# Patient Record
Sex: Male | Born: 1995 | Race: Black or African American | Hispanic: No | Marital: Married
Health system: Southern US, Community
[De-identification: ages and names within clinical notes are randomized; demographics above are authoritative.]

## PROBLEM LIST (undated history)

## (undated) DIAGNOSIS — L309 Dermatitis, unspecified: Secondary | ICD-10-CM

## (undated) DIAGNOSIS — T7840XA Allergy, unspecified, initial encounter: Secondary | ICD-10-CM

## (undated) HISTORY — DX: Allergy, unspecified, initial encounter: T78.40XA

## (undated) HISTORY — DX: Dermatitis, unspecified: L30.9

---
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
## 2021-07-25 ENCOUNTER — Other Ambulatory Visit: Payer: Self-pay

## 2021-07-25 ENCOUNTER — Emergency Department (HOSPITAL_COMMUNITY)
Admission: EM | Admit: 2021-07-25 | Discharge: 2021-07-25 | Disposition: A | Discharge status: Home / Self Care | Payer: Self-pay | Attending: Emergency Medicine | Admitting: Emergency Medicine

## 2021-07-25 DIAGNOSIS — X503XXA Overexertion from repetitive movements, initial encounter: Secondary | ICD-10-CM | POA: Insufficient documentation

## 2021-07-25 DIAGNOSIS — S76912A Strain of unspecified muscles, fascia and tendons at thigh level, left thigh, initial encounter: Secondary | ICD-10-CM | POA: Insufficient documentation

## 2021-07-25 DIAGNOSIS — Y9367 Activity, basketball: Secondary | ICD-10-CM | POA: Insufficient documentation

## 2021-07-25 MED ORDER — METHOCARBAMOL 500 MG PO TABS: 500.0000 mg | ORAL_TABLET | Freq: Two times a day (BID) | ORAL | 0 refills | Status: AC

## 2021-07-25 NOTE — Discharge Instructions (Addendum)
Take the medicine as needed to help with your muscle strain. Stretches will help as well.  You can apply heat to the muscle area as well. Make sure you are practicing range of motion. Follow-up with a sports medicine provider listed below. Return to the ER if you start to experience worsening symptoms, leg swelling, redness or warmth of your leg, additional injuries

## 2021-07-25 NOTE — ED Provider Notes (Signed)
[facility] DEPT Provider Note   CSN: [id number] Arrival date & time: 07/25/21  [id number]     History  Chief Complaint  Patient presents with   thigh pain    Louis Myers is a 26 y.o. male presenting to the ED with a chief complaint of left medial thigh pain.  States that on 07/15/2021 he was playing basketball when he believes when he landed after attempting to shoot the ball that he began having pain in his left medial lower thigh area.  Since then he only reports pain when he tries to "do a crossover move."  States that he is ambulating at baseline, able to move his left hip and knee without difficulty.  He denies pain with palpation.  Denies any leg swelling or calf pain.  He has been taking NSAIDs and icing the area with some improvement.  Denies any prior injury similar to this  HPI     Home Medications Prior to Admission medications   Medication Sig Start Date End Date Taking? Authorizing Provider  methocarbamol (ROBAXIN) 500 MG tablet Take 1 tablet (500 mg total) by mouth 2 (two) times daily. 07/25/21  Yes Brent Noto, PA-C  terbinafine (LAMISIL) 250 MG tablet Take 250 mg by mouth daily.      [provider]      Allergies    Patient has no known allergies.    Review of Systems   Review of Systems  Constitutional:  Negative for chills and fever.  Musculoskeletal:  Positive for myalgias.  Skin:  Negative for wound.  Neurological:  Negative for weakness and numbness.   Physical Exam Updated Vital Signs BP (!) 127/117   Pulse 67   Temp 98.1 F (36.7 C) (Oral)   Resp 18   Ht 6\' 3"  (1.905 m)   Wt 79.4 kg   SpO2 98%   BMI 21.87 kg/m  Physical Exam Vitals and nursing note reviewed.  Constitutional:      General: He is not in acute distress.    Appearance: He is well-developed. He is not diaphoretic.  HENT:     Head: Normocephalic and atraumatic.  Eyes:     General: No scleral icterus.    Conjunctiva/sclera:  Conjunctivae normal.  Pulmonary:     Effort: Pulmonary effort is normal. No respiratory distress.  Musculoskeletal:        General: No swelling or tenderness.     Cervical back: Normal range of motion.     Right lower leg: No edema.     Left lower leg: No edema.     Comments: Normal range of motion of left knee and hip.  No erythema, edema or warmth of extremity.  No calf tenderness noted.  2+ DP pulse palpated.  No tenderness to palpation of the thigh.  No deformities.  Skin:    Findings: No rash.  Neurological:     Mental Status: He is alert.    ED Results / Procedures / Treatments   Labs (all labs ordered are listed, but only abnormal results are displayed) Labs Reviewed - No data to display  EKG None  Radiology No results found.  Procedures Procedures    Medications Ordered in ED Medications - No data to display  ED Course/ Medical Decision Making/ A&P                           Medical Decision Making  26 year old male presenting to the  ED with a chief complaint of left medial lower thigh pain after planting his feet during a basketball game on 07/15/2021.  Since then he has only had pain with doing certain movements while playing basketball.  He is able to ambulate without difficulty and move joints distal and proximal to this area.  On physical exam there is no overlying skin changes of the noted area.  No erythema or warmth of joints.  That does not appear swollen.  He has no calf tenderness.  He has equal and intact distal pulses of bilateral lower extremities.  He is ambulating without difficulty.  I doubt that his symptoms are due to a DVT based on physical exam findings, history and the fact that his pain began after playing basketball.  I did discuss ultrasound to rule out a DVT but he is declining.  We will have him follow-up with sports medicine, complete stretches and take muscle relaxer for what appears to be a muscle strain.  Patient is agreeable to the plan.   Return precautions given   Patient is hemodynamically stable, in NAD, and able to ambulate in the ED. Evaluation does not show pathology that would require ongoing emergent intervention or inpatient treatment. I explained the diagnosis to the patient. Pain has been managed and has no complaints prior to discharge. Patient is comfortable with above plan and is stable for discharge at this time. All questions were answered prior to disposition. Strict return precautions for returning to the ED were discussed. Encouraged follow up with PCP.   An After Visit Summary was printed and given to the patient.   Portions of this note were generated with [employer]. Dictation errors may occur despite best attempts at proofreading.         Final Clinical Impression(s) / ED Diagnoses Final diagnoses:  Muscle strain of left thigh, initial encounter    Rx / DC Orders ED Discharge Orders          Ordered    methocarbamol (ROBAXIN) 500 MG tablet  2 times daily        07/25/21 0715              Delia Heady, PA-C 07/25/21 0724    Ezequiel Essex, MD 07/25/21 [phone]

## 2021-07-25 NOTE — ED Triage Notes (Signed)
Pt reports with left thigh pain x 2 weeks. Pt states that he was injured in a game. Pt is able to do certain movements without pain, but others cause him pain. He wants to make sure that he doesn't do any further damage.

## 2022-02-18 DIAGNOSIS — Z419 Encounter for procedure for purposes other than remedying health state, unspecified: Secondary | ICD-10-CM | POA: Diagnosis not present

## 2022-03-21 DIAGNOSIS — Z419 Encounter for procedure for purposes other than remedying health state, unspecified: Secondary | ICD-10-CM | POA: Diagnosis not present

## 2022-04-19 DIAGNOSIS — Z419 Encounter for procedure for purposes other than remedying health state, unspecified: Secondary | ICD-10-CM | POA: Diagnosis not present

## 2022-05-20 DIAGNOSIS — Z419 Encounter for procedure for purposes other than remedying health state, unspecified: Secondary | ICD-10-CM | POA: Diagnosis not present

## 2022-06-19 DIAGNOSIS — Z419 Encounter for procedure for purposes other than remedying health state, unspecified: Secondary | ICD-10-CM | POA: Diagnosis not present

## 2022-07-20 DIAGNOSIS — Z419 Encounter for procedure for purposes other than remedying health state, unspecified: Secondary | ICD-10-CM | POA: Diagnosis not present

## 2022-08-19 DIAGNOSIS — Z419 Encounter for procedure for purposes other than remedying health state, unspecified: Secondary | ICD-10-CM | POA: Diagnosis not present

## 2022-09-14 DIAGNOSIS — Z6829 Body mass index (BMI) 29.0-29.9, adult: Secondary | ICD-10-CM | POA: Diagnosis not present

## 2022-09-14 DIAGNOSIS — E559 Vitamin D deficiency, unspecified: Secondary | ICD-10-CM | POA: Diagnosis not present

## 2022-09-14 DIAGNOSIS — R030 Elevated blood-pressure reading, without diagnosis of hypertension: Secondary | ICD-10-CM | POA: Diagnosis not present

## 2022-09-14 DIAGNOSIS — M545 Low back pain, unspecified: Secondary | ICD-10-CM | POA: Diagnosis not present

## 2022-09-14 DIAGNOSIS — Z0000 Encounter for general adult medical examination without abnormal findings: Secondary | ICD-10-CM | POA: Diagnosis not present

## 2022-09-14 DIAGNOSIS — Z1159 Encounter for screening for other viral diseases: Secondary | ICD-10-CM | POA: Diagnosis not present

## 2022-09-14 DIAGNOSIS — R5383 Other fatigue: Secondary | ICD-10-CM | POA: Diagnosis not present

## 2022-09-19 DIAGNOSIS — Z419 Encounter for procedure for purposes other than remedying health state, unspecified: Secondary | ICD-10-CM | POA: Diagnosis not present

## 2022-10-20 DIAGNOSIS — Z419 Encounter for procedure for purposes other than remedying health state, unspecified: Secondary | ICD-10-CM | POA: Diagnosis not present

## 2022-11-19 DIAGNOSIS — Z419 Encounter for procedure for purposes other than remedying health state, unspecified: Secondary | ICD-10-CM | POA: Diagnosis not present

## 2022-12-20 DIAGNOSIS — Z419 Encounter for procedure for purposes other than remedying health state, unspecified: Secondary | ICD-10-CM | POA: Diagnosis not present

## 2023-01-19 DIAGNOSIS — Z419 Encounter for procedure for purposes other than remedying health state, unspecified: Secondary | ICD-10-CM | POA: Diagnosis not present

## 2023-02-19 DIAGNOSIS — Z419 Encounter for procedure for purposes other than remedying health state, unspecified: Secondary | ICD-10-CM | POA: Diagnosis not present

## 2023-03-22 DIAGNOSIS — Z419 Encounter for procedure for purposes other than remedying health state, unspecified: Secondary | ICD-10-CM | POA: Diagnosis not present

## 2023-04-19 DIAGNOSIS — Z419 Encounter for procedure for purposes other than remedying health state, unspecified: Secondary | ICD-10-CM | POA: Diagnosis not present

## 2023-05-31 DIAGNOSIS — Z419 Encounter for procedure for purposes other than remedying health state, unspecified: Secondary | ICD-10-CM | POA: Diagnosis not present

## 2023-06-30 DIAGNOSIS — Z419 Encounter for procedure for purposes other than remedying health state, unspecified: Secondary | ICD-10-CM | POA: Diagnosis not present

## 2023-07-31 DIAGNOSIS — Z419 Encounter for procedure for purposes other than remedying health state, unspecified: Secondary | ICD-10-CM | POA: Diagnosis not present

## 2023-08-30 DIAGNOSIS — Z419 Encounter for procedure for purposes other than remedying health state, unspecified: Secondary | ICD-10-CM | POA: Diagnosis not present

## 2023-09-30 DIAGNOSIS — Z419 Encounter for procedure for purposes other than remedying health state, unspecified: Secondary | ICD-10-CM | POA: Diagnosis not present

## 2023-09-30 DIAGNOSIS — S7011XA Contusion of right thigh, initial encounter: Secondary | ICD-10-CM | POA: Diagnosis not present

## 2023-09-30 DIAGNOSIS — S76101A Unspecified injury of right quadriceps muscle, fascia and tendon, initial encounter: Secondary | ICD-10-CM | POA: Diagnosis not present

## 2023-10-07 ENCOUNTER — Encounter (HOSPITAL_COMMUNITY): Payer: Self-pay

## 2023-10-07 ENCOUNTER — Emergency Department (HOSPITAL_COMMUNITY)
Admission: EM | Admit: 2023-10-07 | Discharge: 2023-10-07 | Disposition: A | Discharge status: Home / Self Care | Attending: Emergency Medicine | Admitting: Emergency Medicine

## 2023-10-07 ENCOUNTER — Other Ambulatory Visit: Payer: Self-pay

## 2023-10-07 DIAGNOSIS — R58 Hemorrhage, not elsewhere classified: Secondary | ICD-10-CM | POA: Insufficient documentation

## 2023-10-07 DIAGNOSIS — S7011XA Contusion of right thigh, initial encounter: Secondary | ICD-10-CM | POA: Diagnosis not present
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
## 2023-10-07 NOTE — Discharge Instructions (Signed)
 Return for any problem.  ?

## 2023-10-31 DIAGNOSIS — Z419 Encounter for procedure for purposes other than remedying health state, unspecified: Secondary | ICD-10-CM | POA: Diagnosis not present

## 2023-12-31 DIAGNOSIS — Z419 Encounter for procedure for purposes other than remedying health state, unspecified: Secondary | ICD-10-CM | POA: Diagnosis not present
